# Patient Record
Sex: Male | Born: 1980 | Hispanic: No | Marital: Married | Smoking: Never smoker
Health system: Southern US, Community
[De-identification: ages and names within clinical notes are randomized; demographics above are authoritative.]

---

## 2006-07-11 ENCOUNTER — Emergency Department (HOSPITAL_COMMUNITY): Admission: AC | Admit: 2006-07-11 | Discharge: 2006-07-11 | Payer: Self-pay

## 2015-10-08 ENCOUNTER — Encounter (HOSPITAL_COMMUNITY): Payer: Self-pay | Admitting: Emergency Medicine

## 2015-10-08 ENCOUNTER — Emergency Department (HOSPITAL_COMMUNITY)
Admission: EM | Admit: 2015-10-08 | Discharge: 2015-10-08 | Disposition: A | Discharge status: Home / Self Care | Payer: Self-pay | Attending: Emergency Medicine | Admitting: Emergency Medicine

## 2015-10-08 ENCOUNTER — Emergency Department (HOSPITAL_COMMUNITY): Payer: Self-pay

## 2015-10-08 DIAGNOSIS — N200 Calculus of kidney: Secondary | ICD-10-CM | POA: Insufficient documentation

## 2015-10-08 LAB — URINALYSIS, ROUTINE W REFLEX MICROSCOPIC
Glucose, UA: NEGATIVE mg/dL
Ketones, ur: 15 mg/dL — AB
Nitrite: NEGATIVE
PROTEIN: 30 mg/dL — AB
Specific Gravity, Urine: 1.027 (ref 1.005–1.030)
pH: 5.5 (ref 5.0–8.0)

## 2015-10-08 LAB — COMPREHENSIVE METABOLIC PANEL
ALBUMIN: 4.6 g/dL (ref 3.5–5.0)
ALK PHOS: 70 U/L (ref 38–126)
ALT: 70 U/L — ABNORMAL HIGH (ref 17–63)
ANION GAP: 9 (ref 5–15)
AST: 30 U/L (ref 15–41)
BILIRUBIN TOTAL: 0.4 mg/dL (ref 0.3–1.2)
BUN: 10 mg/dL (ref 6–20)
CALCIUM: 9.2 mg/dL (ref 8.9–10.3)
CO2: 24 mmol/L (ref 22–32)
Chloride: 106 mmol/L (ref 101–111)
Creatinine, Ser: 0.97 mg/dL (ref 0.61–1.24)
GFR calc non Af Amer: >60 mL/min (ref >60)
Glucose, Bld: 111 mg/dL — ABNORMAL HIGH (ref 65–99)
POTASSIUM: 3.6 mmol/L (ref 3.5–5.1)
SODIUM: 139 mmol/L (ref 135–145)
TOTAL PROTEIN: 7.7 g/dL (ref 6.5–8.1)

## 2015-10-08 LAB — URINE MICROSCOPIC-ADD ON

## 2015-10-08 LAB — CBC
HEMATOCRIT: 44 % (ref 39.0–52.0)
HEMOGLOBIN: 15.1 g/dL (ref 13.0–17.0)
MCH: 29.2 pg (ref 26.0–34.0)
MCHC: 34.3 g/dL (ref 30.0–36.0)
MCV: 85.1 fL (ref 78.0–100.0)
Platelets: 272 10*3/uL (ref 150–400)
RBC: 5.17 MIL/uL (ref 4.22–5.81)
RDW: 12.9 % (ref 11.5–15.5)
WBC: 11.3 10*3/uL — ABNORMAL HIGH (ref 4.0–10.5)

## 2015-10-08 LAB — LIPASE, BLOOD: Lipase: 17 U/L (ref 11–51)

## 2015-10-08 MED ORDER — OXYCODONE-ACETAMINOPHEN 5-325 MG PO TABS: 1.0000 | ORAL_TABLET | Freq: Four times a day (QID) | ORAL | Status: DC | PRN

## 2015-10-08 MED ORDER — KETOROLAC TROMETHAMINE 30 MG/ML IJ SOLN
30.0000 mg | Freq: Once | INTRAMUSCULAR | Status: AC
  Administered 2015-10-08: 30 mg via INTRAVENOUS
  Filled 2015-10-08: qty 1

## 2015-10-08 MED ORDER — OXYCODONE-ACETAMINOPHEN 5-325 MG PO TABS
2.0000 | ORAL_TABLET | Freq: Once | ORAL | Status: AC
  Administered 2015-10-08: 2 via ORAL
  Filled 2015-10-08: qty 2

## 2015-10-08 MED ORDER — TAMSULOSIN HCL 0.4 MG PO CAPS: 0.4000 mg | ORAL_CAPSULE | Freq: Every day | ORAL | Status: DC

## 2015-10-08 MED ORDER — SODIUM CHLORIDE 0.9 % IV BOLUS (SEPSIS)
1000.0000 mL | Freq: Once | INTRAVENOUS | Status: AC
  Administered 2015-10-08: 1000 mL via INTRAVENOUS

## 2015-10-08 MED ORDER — HYDROMORPHONE HCL 1 MG/ML IJ SOLN
1.0000 mg | Freq: Once | INTRAMUSCULAR | Status: AC
  Administered 2015-10-08: 1 mg via INTRAVENOUS
  Filled 2015-10-08: qty 1

## 2015-10-08 MED ORDER — PROMETHAZINE HCL 25 MG PO TABS: 25.0000 mg | ORAL_TABLET | Freq: Four times a day (QID) | ORAL | Status: DC | PRN

## 2015-10-08 NOTE — ED Notes (Signed)
MD at bedside. 

## 2015-10-08 NOTE — ED Notes (Signed)
Pt asking for more pain medication. Explained pt has already had over the maximum amount of pain medication we can give that was given by EMS ( fentanyl). Explained we are waiting on a room to become available for a provider to see the pt. Pt walked into hall and would not go back into room. Charge RN spoke with pt and going to speak with a physician regarding pain medication. Family started filming in hallway. Explained photography is not allowed due to privacy concerns with other patients. Family put away phone, explained if filming started again security would be called.

## 2015-10-08 NOTE — ED Provider Notes (Signed)
CSN: [id number]     Arrival date & time 10/08/15  1556 History   First MD Initiated Contact with Patient 10/08/15 1652     Chief Complaint  Patient presents with  . Flank Pain     (Consider location/radiation/quality/duration/timing/severity/associated sxs/prior Treatment) HPI Comments: 35 year old male who presents with right lower quadrant and right flank pain. The patient states that yesterday he began having right lower quadrant pain. The pain was mild and eventually got better without intervention. Today while at work, his pain returned and he also began having pain in his right flank. The pain became severe at work and he called EMS. He received 250 g of fentanyl in route but states that his pain is still severe. The pain radiates into his groin but he denies any specific testicular pain. No problems with urination or hematuria that he is noticed. No diarrhea or vomiting but he does endorse nausea. The pain feels better when he is moving around.  Patient is a 35 y.o. male presenting with flank pain. The history is provided by the patient.  Flank Pain    History reviewed. No pertinent past medical history. History reviewed. No pertinent past surgical history. History reviewed. No pertinent family history. Social History  Substance Use Topics  . Smoking status: Never Smoker   . Smokeless tobacco: None  . Alcohol Use: No    Review of Systems  Genitourinary: Positive for flank pain.   10 Systems reviewed and are negative for acute change except as noted in the HPI.    Allergies  Review of patient's allergies indicates no known allergies.  Home Medications   Prior to Admission medications   Medication Sig Start Date End Date Taking? Authorizing Provider  oxyCODONE-acetaminophen (PERCOCET) 5-325 MG tablet Take 1-2 tablets by mouth every 6 (six) hours as needed for severe pain. 10/08/15   Laurence Spates, MD  promethazine (PHENERGAN) 25 MG tablet Take 1 tablet (25 mg  total) by mouth every 6 (six) hours as needed for nausea or vomiting. 10/08/15   Laurence Spates, MD  tamsulosin (FLOMAX) 0.4 MG CAPS capsule Take 1 capsule (0.4 mg total) by mouth daily. 10/08/15   Ambrose Finland Mayra Brahm, MD   BP 120/71 mmHg  Pulse 71  Temp(Src) 97.8 F (36.6 C) (Oral)  Resp 14  Ht  (1.727 m)  Wt 235 lb (106.595 kg)  BMI 35.74 kg/m2  SpO2 94% Physical Exam  Constitutional: He is oriented to person, place, and time. He appears well-developed and well-nourished.  Uncomfortable, pacing room  HENT:  Head: Normocephalic and atraumatic.  Mouth/Throat: Oropharynx is clear and moist.  Moist mucous membranes  Eyes: Conjunctivae are normal. Pupils are equal, round, and reactive to light.  Neck: Neck supple.  Cardiovascular: Normal rate, regular rhythm and normal heart sounds.   No murmur heard. Pulmonary/Chest: Effort normal and breath sounds normal.  Abdominal: Soft. Bowel sounds are normal. He exhibits no distension. There is tenderness (RLQ and R of umbilicus tenderness).  Genitourinary:  R CVA tenderness  Musculoskeletal: He exhibits no edema.  Neurological: He is alert and oriented to person, place, and time.  Fluent speech, normal gait  Skin: Skin is warm and dry.  Psychiatric: Judgment normal.  Distressed  Nursing note and vitals reviewed.   ED Course  Procedures (including critical care time) Labs Review Labs Reviewed  COMPREHENSIVE METABOLIC PANEL - Abnormal; Notable for the following:    Glucose, Bld 111 (*)    ALT 70 (*)  All other components within normal limits  CBC - Abnormal; Notable for the following:    WBC 11.3 (*)    All other components within normal limits  URINALYSIS, ROUTINE W REFLEX MICROSCOPIC (NOT AT [facility]) - Abnormal; Notable for the following:    Color, Urine AMBER (*)    APPearance TURBID (*)    Hgb urine dipstick LARGE (*)    Bilirubin Urine SMALL (*)    Ketones, ur 15 (*)    Protein, ur 30 (*)    Leukocytes, UA TRACE  (*)    All other components within normal limits  URINE MICROSCOPIC-ADD ON - Abnormal; Notable for the following:    Squamous Epithelial / LPF 0-5 (*)    Bacteria, UA FEW (*)    Casts HYALINE CASTS (*)    Crystals CA OXALATE CRYSTALS (*)    All other components within normal limits  LIPASE, BLOOD    Imaging Review Ct Renal Stone Study  10/08/2015  CLINICAL DATA:  Right flank pain, right lower quadrant pain. EXAM: CT ABDOMEN AND PELVIS WITHOUT CONTRAST TECHNIQUE: Multidetector CT imaging of the abdomen and pelvis was performed following the standard protocol without IV contrast. COMPARISON:  None. FINDINGS: Lower chest:  No acute findings. Hepatobiliary: No mass visualized on this un-enhanced exam. Pancreas: No mass or inflammatory process identified on this un-enhanced exam. Spleen: Within normal limits in size. Adrenals/Urinary Tract: Normal adrenal glands. 4 mm proximal right ureteral calculus resulting in mild hydronephrosis. Normal left kidney. Normal bladder. Stomach/Bowel: No evidence of obstruction, inflammatory process, or abnormal fluid collections. Vascular/Lymphatic: No pathologically enlarged lymph nodes. No evidence of abdominal aortic aneurysm. Reproductive: No mass or other significant abnormality. Other: None. Musculoskeletal:  No suspicious bone lesions identified. IMPRESSION: 1. 4 mm proximal right ureteral calculus resulting in mild hydronephrosis. Electronically Signed   By: Elige Ko   On: 10/08/2015 18:06   I have personally reviewed and evaluated these lab results as part of my medical decision-making.   EKG Interpretation None     Medications  HYDROmorphone (DILAUDID) injection 1 mg (1 mg Intravenous Given 10/08/15 1706)  sodium chloride 0.9 % bolus 1,000 mL (0 mLs Intravenous Stopped 10/08/15 1819)  oxyCODONE-acetaminophen (PERCOCET/ROXICET) 5-325 MG per tablet 2 tablet (2 tablets Oral Given 10/08/15 1852)  ketorolac (TORADOL) 30 MG/ML injection 30 mg (30 mg  Intravenous Given 10/08/15 1852)    MDM   Final diagnoses:  Kidney stone    Patient presents with right lower quadrant pain that began yesterday and returns today as well as right flank pain and nausea. He was uncomfortable and pacing the room at presentation. Vital signs stable. Right lower quadrant, right mid abdominal, and right flank tenderness noted. He states his pain is better when moving around, therefore I suspect kidney stone more likely than appendicitis given his presentation. Gave the patient Dilaudid and obtained above lab work.UA with large amount of blood, WBC 11.3. Obtained CT renal study to evaluate for kidney stone. CT showed a 4 mm proximal right ureteral stone with mild hydronephrosis.   On reexamination after receiving Percocet and Toradol, the patient was resting comfortably. I discussed CT findings and discussed treatment of kidney stone including pain and nausea control as well as urology follow-up. Patient without vomiting and pain well controlled at time of discharge. Provided with pain medications and Phenergan to use as needed at home as well as urology clinic information. Extensively reviewed return precautions including fever, intractable pain, or intractable vomiting. Patient voiced understanding and was  discharged in satisfactory condition.   Laurence Spatesachel Morgan Debralee Braaksma, MD 10/09/15 [phone]

## 2015-10-08 NOTE — ED Notes (Signed)
Pt reports of RLQ abd pain radiating down to groin that started last night. Went to work this am [employer] and symptoms became worse. No in L flank. EMS gave 4mg  zofran and 250mcg fentanyl prior to arrival.

## 2017-05-30 ENCOUNTER — Ambulatory Visit (HOSPITAL_COMMUNITY)
Admission: EM | Admit: 2017-05-30 | Discharge: 2017-05-30 | Disposition: A | Discharge status: Home / Self Care | Payer: Self-pay | Attending: Family Medicine | Admitting: Family Medicine

## 2017-05-30 ENCOUNTER — Ambulatory Visit (INDEPENDENT_AMBULATORY_CARE_PROVIDER_SITE_OTHER): Payer: Self-pay

## 2017-05-30 ENCOUNTER — Other Ambulatory Visit: Payer: Self-pay

## 2017-05-30 ENCOUNTER — Encounter (HOSPITAL_COMMUNITY): Payer: Self-pay

## 2017-05-30 DIAGNOSIS — M79672 Pain in left foot: Secondary | ICD-10-CM

## 2017-05-30 DIAGNOSIS — M25512 Pain in left shoulder: Secondary | ICD-10-CM

## 2017-05-30 DIAGNOSIS — M5489 Other dorsalgia: Secondary | ICD-10-CM

## 2017-05-30 DIAGNOSIS — M542 Cervicalgia: Secondary | ICD-10-CM

## 2017-05-30 MED ORDER — MELOXICAM 7.5 MG PO TABS: 7.5000 mg | ORAL_TABLET | Freq: Every day | ORAL | 0 refills | Status: DC

## 2017-05-30 MED ORDER — CYCLOBENZAPRINE HCL 10 MG PO TABS: 5.0000 mg | ORAL_TABLET | Freq: Every evening | ORAL | 0 refills | Status: DC | PRN
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
## 2017-09-06 ENCOUNTER — Encounter (HOSPITAL_COMMUNITY): Payer: Self-pay | Admitting: Emergency Medicine

## 2017-09-06 ENCOUNTER — Emergency Department (HOSPITAL_COMMUNITY)
Admission: EM | Admit: 2017-09-06 | Discharge: 2017-09-06 | Disposition: A | Discharge status: Home / Self Care | Payer: Self-pay | Attending: Emergency Medicine | Admitting: Emergency Medicine

## 2017-09-06 DIAGNOSIS — Z23 Encounter for immunization: Secondary | ICD-10-CM | POA: Insufficient documentation

## 2017-09-06 DIAGNOSIS — Y939 Activity, unspecified: Secondary | ICD-10-CM | POA: Insufficient documentation

## 2017-09-06 DIAGNOSIS — W501XXA Accidental kick by another person, initial encounter: Secondary | ICD-10-CM | POA: Insufficient documentation

## 2017-09-06 DIAGNOSIS — Y999 Unspecified external cause status: Secondary | ICD-10-CM | POA: Insufficient documentation

## 2017-09-06 DIAGNOSIS — Y929 Unspecified place or not applicable: Secondary | ICD-10-CM | POA: Insufficient documentation

## 2017-09-06 DIAGNOSIS — S0181XA Laceration without foreign body of other part of head, initial encounter: Secondary | ICD-10-CM

## 2017-09-06 MED ORDER — TETANUS-DIPHTH-ACELL PERTUSSIS 5-2.5-18.5 LF-MCG/0.5 IM SUSP
0.5000 mL | Freq: Once | INTRAMUSCULAR | Status: AC
  Administered 2017-09-06: 0.5 mL via INTRAMUSCULAR
  Filled 2017-09-06: qty 0.5
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
## 2017-12-10 IMAGING — CT CT RENAL STONE PROTOCOL
2 of 3 series · 17 of 46 positions shown, 19 images · non-contrast
Comparison: None.

CLINICAL DATA: Right flank pain, right lower quadrant pain.

EXAM:
CT ABDOMEN AND PELVIS WITHOUT CONTRAST
TECHNIQUE: Multidetector CT imaging of the abdomen and pelvis was performed
following the standard protocol without IV contrast.

[Series 4: lung · axial · 0.85mm/px · z∈[-80,+34]mm · 14 of 67 slices shown, 16 images]
[im 5/67  soft-tissue]
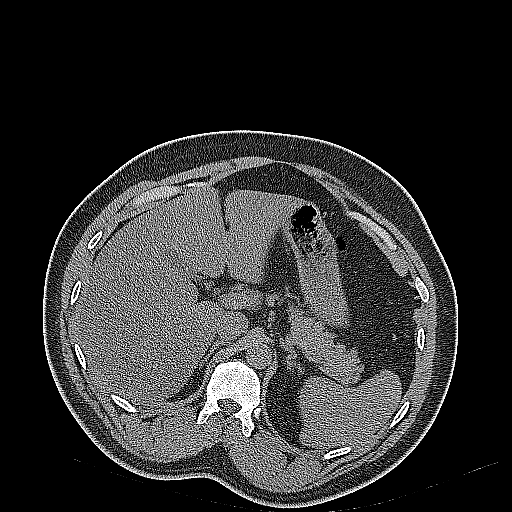
[im 5/67  bone]
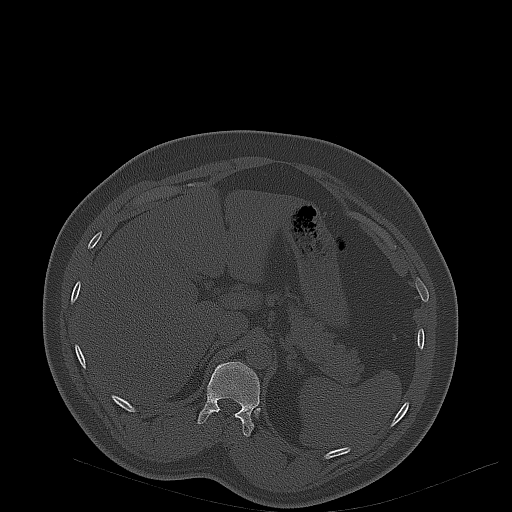
[im 9/67  soft-tissue]
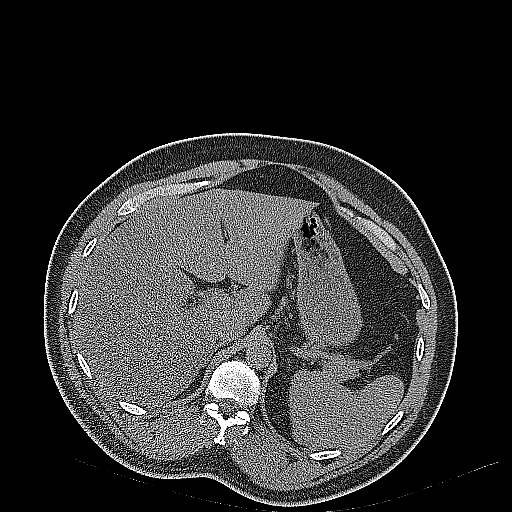
[im 13/67  soft-tissue]
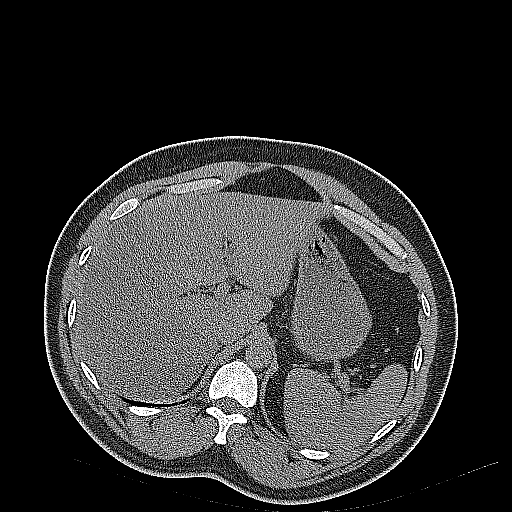
[im 18/67  soft-tissue]
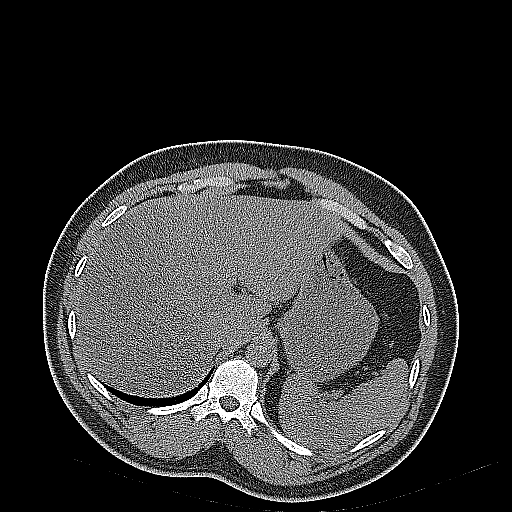
[im 22/67  soft-tissue]
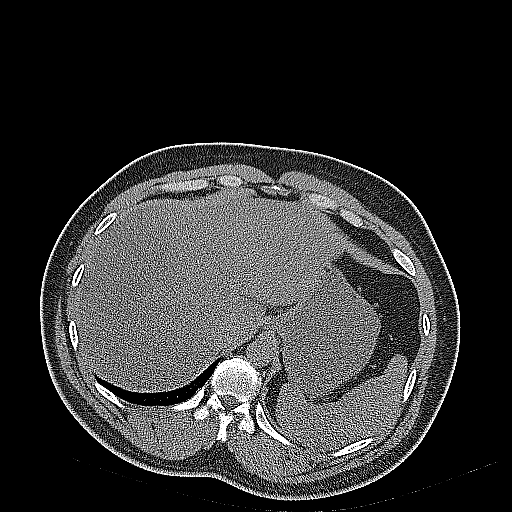
[im 26/67  soft-tissue]
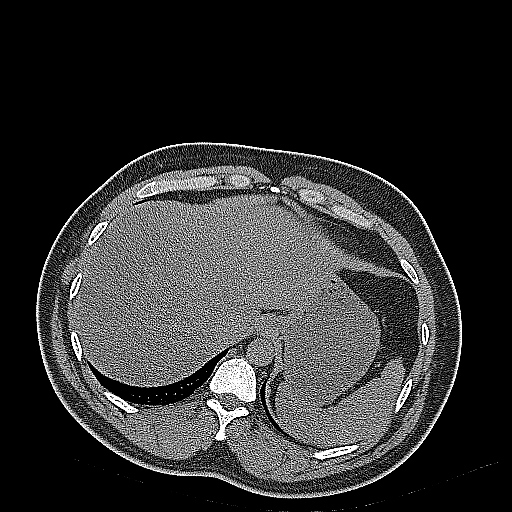
[im 30/67  soft-tissue]
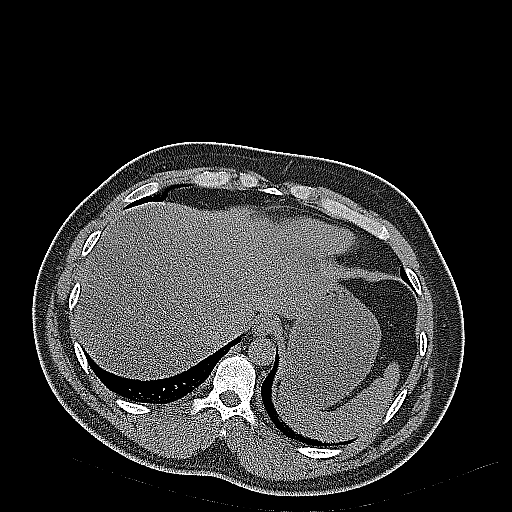
[im 37/67  soft-tissue]
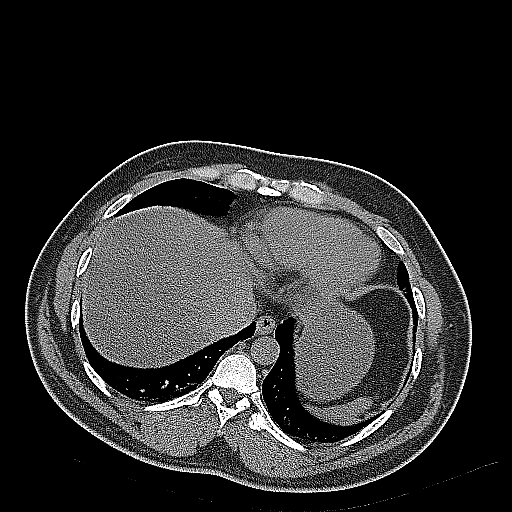
[im 41/67  soft-tissue]
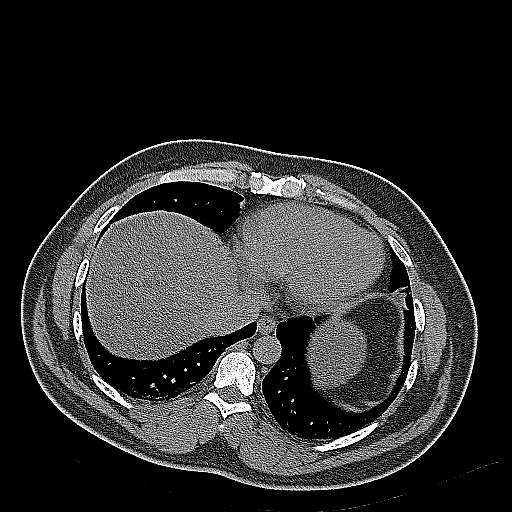
[im 41/67  bone]
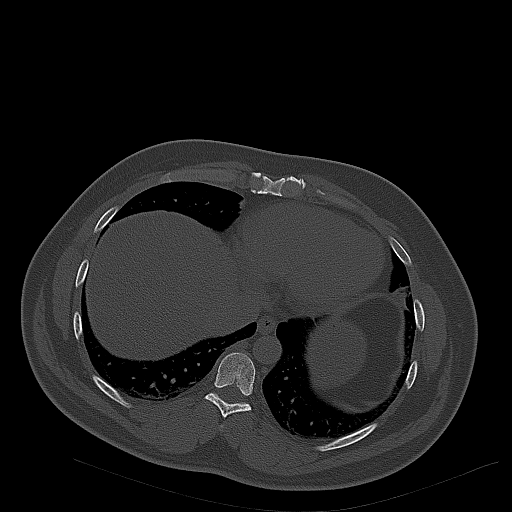
[im 45/67  soft-tissue]
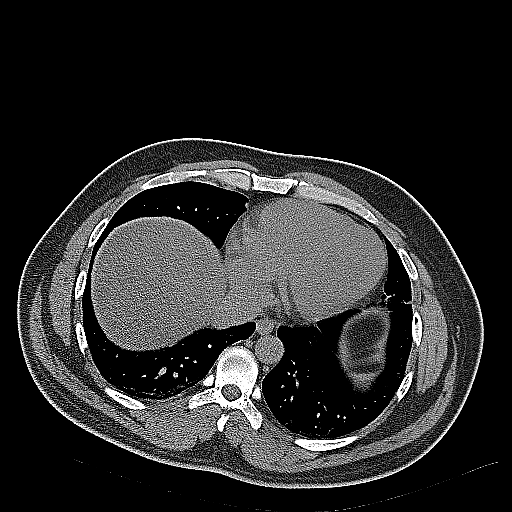
[im 49/67  soft-tissue]
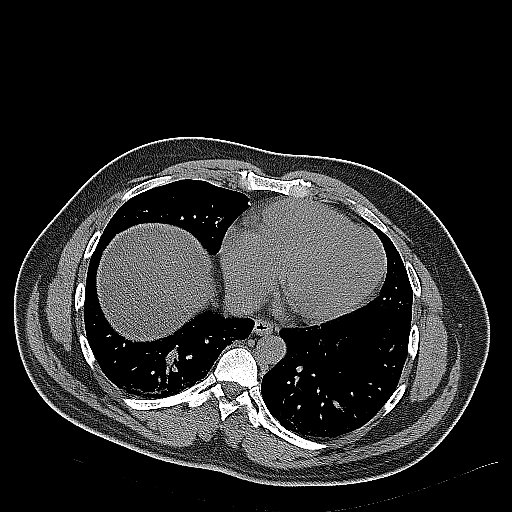
[im 54/67  soft-tissue]
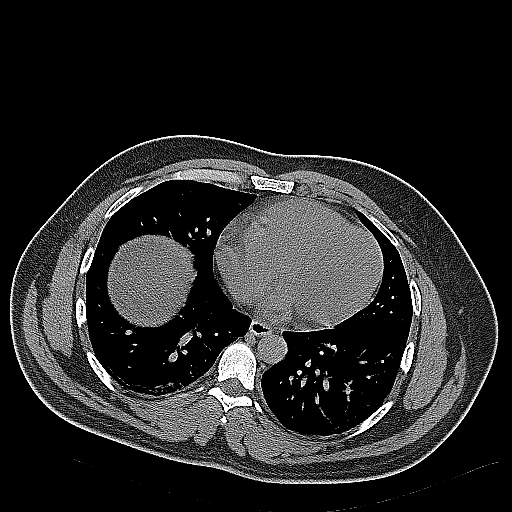
[im 58/67  soft-tissue]
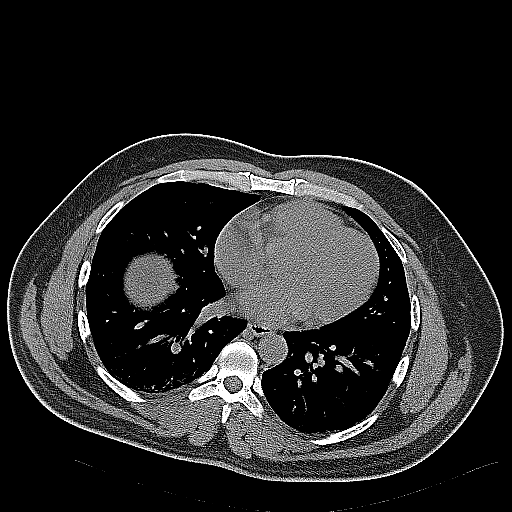
[im 62/67  soft-tissue]
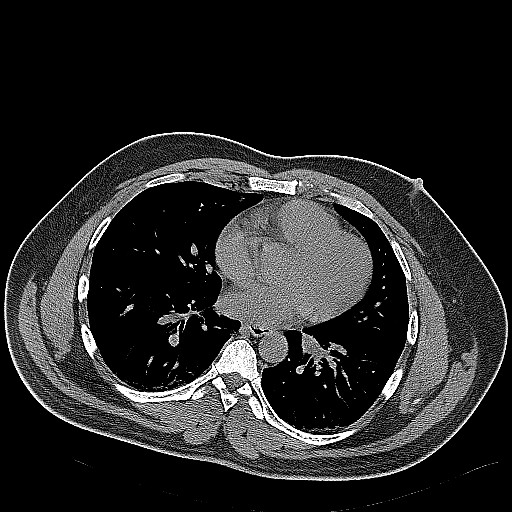

[Series 5: coronal · coronal · 0.84mm/px · 3 of 178 slices shown]
[im 60/178  soft-tissue]
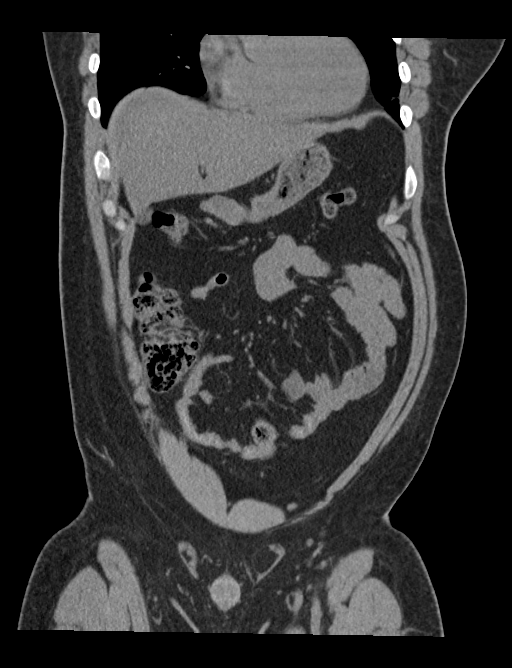
[im 79/178  soft-tissue]
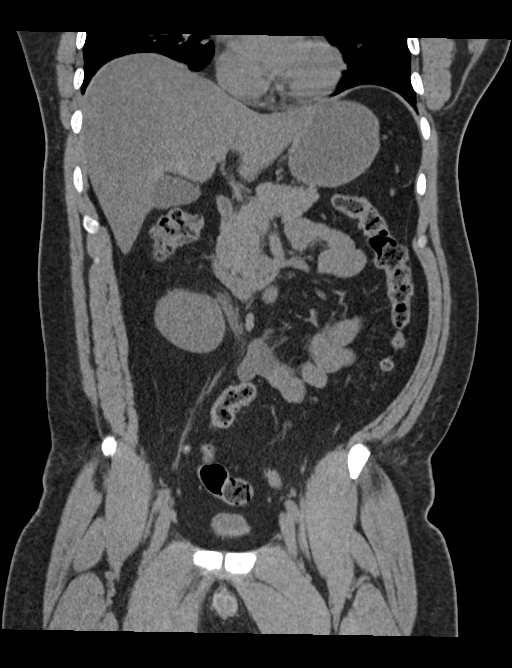
[im 99/178  soft-tissue]
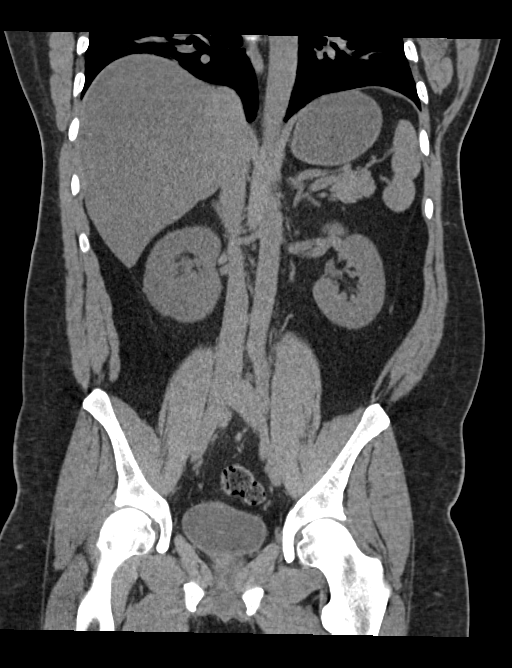

[17 of 46 positions shown; findings below may reference images not displayed]

FINDINGS: Lower chest:  No acute findings.

Hepatobiliary: No mass visualized on this un-enhanced exam.

Pancreas: No mass or inflammatory process identified on this
un-enhanced exam.

Spleen: Within normal limits in size.

Adrenals/Urinary Tract: Normal adrenal glands. 4 mm proximal right
ureteral calculus resulting in mild hydronephrosis. Normal left
kidney. Normal bladder.

Stomach/Bowel: No evidence of obstruction, inflammatory process, or
abnormal fluid collections.

Vascular/Lymphatic: No pathologically enlarged lymph nodes. No
evidence of abdominal aortic aneurysm.

Reproductive: No mass or other significant abnormality.

Other: None.

Musculoskeletal:  No suspicious bone lesions identified.
IMPRESSION: 1. 4 mm proximal right ureteral calculus resulting in mild
hydronephrosis.

## 2018-11-14 ENCOUNTER — Other Ambulatory Visit: Payer: Self-pay

## 2018-11-14 ENCOUNTER — Emergency Department (HOSPITAL_COMMUNITY)
Admission: EM | Admit: 2018-11-14 | Discharge: 2018-11-14 | Disposition: A | Discharge status: Home / Self Care | Payer: Self-pay | Attending: Emergency Medicine | Admitting: Emergency Medicine

## 2018-11-14 ENCOUNTER — Encounter (HOSPITAL_COMMUNITY): Payer: Self-pay | Admitting: *Deleted

## 2018-11-14 DIAGNOSIS — R21 Rash and other nonspecific skin eruption: Secondary | ICD-10-CM | POA: Insufficient documentation

## 2018-11-14 DIAGNOSIS — Z79899 Other long term (current) drug therapy: Secondary | ICD-10-CM | POA: Insufficient documentation

## 2018-11-14 MED ORDER — PREDNISONE 10 MG (21) PO TBPK: ORAL_TABLET | Freq: Every day | ORAL | 0 refills | Status: DC

## 2018-11-14 MED ORDER — DIPHENHYDRAMINE HCL 25 MG PO TABS: 25.0000 mg | ORAL_TABLET | Freq: Four times a day (QID) | ORAL | 0 refills | Status: DC

## 2018-11-14 MED ORDER — FAMOTIDINE 20 MG PO TABS: 20.0000 mg | ORAL_TABLET | Freq: Two times a day (BID) | ORAL | 0 refills | Status: DC

## 2018-11-14 NOTE — ED Provider Notes (Signed)
[facility] Provider Note   CSN: [id number] Arrival date & time: 11/14/18  1433    History   Chief Complaint Chief Complaint  Patient presents with   Rash    HPI Gregory Mclaughlin is a 38 y.o. male who presents for evaluation of rash noted to bilateral fevers, right upper extremity.  He reports that about 5 days ago, he was working outside and states that he thinks he got into some poison ivy.  He also states he was stung by some yellow jacket.  He states that initially, he had some rash to his arms and hands.  He states he took over-the-counter Benadryl which improved rash on hands.  He states that about 3 days ago, he started noticing some irritation to his bilateral ears.  He states that the area started getting swollen, inflamed.  He states that he is continue taking the Benadryl with no improvement in symptoms.  He has noted some drainage from the ears.  He has not noted any fevers.  He states he has not had any difficulty swallowing, breathing, vomiting, swelling of his tongue or lips.  Patient denies any other rash in any other places.  He states he thinks he may have an allergy to yellow jacket but does not know of any other allergies.     The history is provided by the patient.    History reviewed. No pertinent past medical history.  There are no active problems to display for this patient.   History reviewed. No pertinent surgical history.      Home Medications    Prior to Admission medications   Medication Sig Start Date End Date Taking? Authorizing Provider  cyclobenzaprine (FLEXERIL) 10 MG tablet Take 0.5-1 tablets (5-10 mg total) by mouth at bedtime as needed for muscle spasms. 05/30/17   Cathie HoopsYu, Amy V, PA-C  diphenhydrAMINE (BENADRYL) 25 MG tablet Take 1 tablet (25 mg total) by mouth every 6 (six) hours for 4 days. 11/14/18 11/18/18  Maxwell CaulLayden, Estefana Taylor A, PA-C  famotidine (PEPCID) 20 MG tablet Take 1 tablet (20 mg total) by mouth 2 (two)  times daily for 4 days. 11/14/18 11/18/18  Maxwell CaulLayden, Brentin Shin A, PA-C  meloxicam (MOBIC) 7.5 MG tablet Take 1 tablet (7.5 mg total) by mouth daily. 05/30/17   Cathie HoopsYu, Amy V, PA-C  predniSONE (STERAPRED UNI-PAK 21 TAB) 10 MG (21) TBPK tablet Take by mouth daily. Take 6 tabs by mouth daily  for 2 days, then 5 tabs for 2 days, then 4 tabs for 2 days, then 3 tabs for 2 days, 2 tabs for 2 days, then 1 tab by mouth daily for 2 days 11/14/18   Maxwell CaulLayden, Jeweliana Dudgeon A, PA-C    Family History No family history on file.  Social History Social History   Tobacco Use   Smoking status: Never Smoker   Smokeless tobacco: Never Used  Substance Use Topics   Alcohol use: No   Drug use: No     Allergies   Patient has no known allergies.   Review of Systems Review of Systems  Constitutional: Negative for fever.  HENT: Positive for facial swelling. Negative for trouble swallowing.   Respiratory: Negative for shortness of breath.   Skin: Positive for rash.  All other systems reviewed and are negative.    Physical Exam Updated Vital Signs BP 110/72    Pulse 66    Temp 98.3 F (36.8 C)    Resp 18    Ht 5\' 8"  (1.727  m)    Wt 106.6 kg    SpO2 97%    BMI 35.73 kg/m   Physical Exam Vitals signs and nursing note reviewed.  Constitutional:      Appearance: He is well-developed.  HENT:     Head: Normocephalic and atraumatic.     Comments: Face is symmetric in appearance.  No overlying warmth, erythema.  No intraoral swelling.  No swelling noted to floor of mouth.    Right Ear: Tympanic membrane normal.     Left Ear: Tympanic membrane normal.     Ears:     Comments: Bilateral ears are edematous with some overlying yellow crusting and erythema.  He has mild maculopapular rash noted around the ears that extends to the base of the skull.  Bilateral mastoid processes are without any overlying warmth, erythema.  Bilateral TMs are without erythema, bulging.    Mouth/Throat:     Comments: Airway is patent, phonation  is intact. Uvula is midline.  No trismus. Eyes:     General: No scleral icterus.       Right eye: No discharge.        Left eye: No discharge.     Conjunctiva/sclera: Conjunctivae normal.  Pulmonary:     Effort: Pulmonary effort is normal.     Comments: Lungs clear to auscultation bilaterally.  Symmetric chest rise.  No wheezing, rales, rhonchi. Skin:    General: Skin is warm and dry.     Findings: Rash present.     Comments: Small amount of maculopapular rash with some linear streaking noted to the upper left arm consistent with poison ivy.  No rash noted on palms.  No intraoral lesions.  Neurological:     Mental Status: He is alert.  Psychiatric:        Speech: Speech normal.        Behavior: Behavior normal.              ED Treatments / Results  Labs (all labs ordered are listed, but only abnormal results are displayed) Labs Reviewed - No data to display  EKG None  Radiology No results found.  Procedures Procedures (including critical care time)  Medications Ordered in ED Medications - No data to display   Initial Impression / Assessment and Plan / ED Course  I have reviewed the triage vital signs and the nursing notes.  Pertinent labs & imaging results that were available during my care of the patient were reviewed by me and considered in my medical decision making (see chart for details).        38 year old male who presents for evaluation of swelling, erythema, irritation noted to bilateral ears.  Reports history of bee sting and poison ivy about 5 days ago.  Initially started on his arms but that resolved after Benadryl.  Noticed about 3 days ago, he started having swelling, irritation of his ears.  No fevers, vomiting. Patient is afebrile, non-toxic appearing, sitting comfortably on examination table. Vital signs reviewed and stable.  On examination, both ears appear edematous with some overlying yellow crusting and erythema.  TMs bilaterally appear  normal.  He has some rash noted around the ear that extends over the base of the skull.  The mastoid processes bilaterally they are without any warmth, erythema, edema.  His exam is not concerning for mastoiditis.  Additionally, he has no systemic signs of illness such as fever, vomiting.  I suspect this is most likely allergic reaction/poison ivy from previous encounter.  At this time, do not feel that he needs any imaging.  Discussed patient with Dr. Reather Converse who is agreeable to plan.  Plan to treat with prednisone.  We will also encourage Benadryl, Pepcid.  I discussed with patient that he needs close follow-up in 3 days with either primary care or ENT.  He cannot arrange follow-up and has worsening symptoms, he is return the emergency department. At this time, patient exhibits no emergent life-threatening condition that require further evaluation in ED or admission. Patient had ample opportunity for questions and discussion. All patient's questions were answered with full understanding. Strict return precautions discussed. Patient expresses understanding and agreement to plan.    Portions of this note were generated with [employer]. Dictation errors may occur despite best attempts at proofreading.   Final Clinical Impressions(s) / ED Diagnoses   Final diagnoses:  Rash    ED Discharge Orders         Ordered    predniSONE (STERAPRED UNI-PAK 21 TAB) 10 MG (21) TBPK tablet  Daily     11/14/18 1716    diphenhydrAMINE (BENADRYL) 25 MG tablet  Every 6 hours     11/14/18 1716    famotidine (PEPCID) 20 MG tablet  2 times daily     11/14/18 1716           Desma Mcgregor 11/15/18 0012    Elnora Morrison, MD 11/15/18 [phone]

## 2018-11-14 NOTE — ED Triage Notes (Signed)
The pt has a rash all over his body  He was sting by yellow jackets 3-4 days ago he was itching all over and he thinks that he w was in poison ivy  Both ears re and sxwsolledn  And he has rash covering the exp[osed [place] of his body  No resp difficulty

## 2018-11-14 NOTE — ED Notes (Signed)
Pt did not want to remove pants, pt provided with gown and pt took off shirt.

## 2018-11-14 NOTE — ED Notes (Signed)
Patient Alert and oriented to baseline. Stable and ambulatory to baseline. Patient verbalized understanding of the discharge instructions.  Patient belongings were taken by the patient.   

## 2018-11-14 NOTE — Discharge Instructions (Signed)
Take prednisone as directed.  Take Benadryl and Pepcid as directed.  As we discussed, you will need for close follow-up in 3 days with either ENT or [facility] primary care.  If you cannot arrange follow-up and your symptoms are worse or not better, return emergency department.  Return emergency department sooner if you experience any fever, difficulty swallowing, difficulty breathing, vomiting, swelling of your tongue or lips or any other worsening or concerning symptoms.

## 2019-10-29 ENCOUNTER — Emergency Department (HOSPITAL_COMMUNITY)
Admission: EM | Admit: 2019-10-29 | Discharge: 2019-10-30 | Disposition: A | Discharge status: Home / Self Care | Payer: Self-pay | Attending: Emergency Medicine | Admitting: Emergency Medicine

## 2019-10-29 DIAGNOSIS — T782XXA Anaphylactic shock, unspecified, initial encounter: Secondary | ICD-10-CM | POA: Insufficient documentation

## 2019-10-29 MED ORDER — SODIUM CHLORIDE 0.9 % IV BOLUS
1000.0000 mL | Freq: Once | INTRAVENOUS | Status: AC
  Administered 2019-10-29: 1000 mL via INTRAVENOUS

## 2019-10-29 MED ORDER — EPINEPHRINE 0.3 MG/0.3ML IJ SOAJ: 0.3000 mg | INTRAMUSCULAR | 0 refills | Status: AC | PRN

## 2019-10-29 MED ORDER — METHYLPREDNISOLONE SODIUM SUCC 125 MG IJ SOLR
125.0000 mg | Freq: Once | INTRAMUSCULAR | Status: AC
  Administered 2019-10-29: 125 mg via INTRAVENOUS
  Filled 2019-10-29: qty 2

## 2019-10-29 MED ORDER — PREDNISONE 20 MG PO TABS: 40.0000 mg | ORAL_TABLET | Freq: Every day | ORAL | 0 refills | Status: AC

## 2019-10-29 MED ORDER — FAMOTIDINE IN NACL 20-0.9 MG/50ML-% IV SOLN
20.0000 mg | Freq: Once | INTRAVENOUS | Status: AC
  Administered 2019-10-29: 20 mg via INTRAVENOUS
  Filled 2019-10-29: qty 50

## 2019-10-29 NOTE — Discharge Instructions (Signed)
Continue 25 to 50 mg of Benadryl every 8 hours as needed as well as taken prednisone as prescribed.

## 2019-10-29 NOTE — ED Provider Notes (Signed)
[facility] EMERGENCY DEPARTMENT Provider Note   CSN: [id number] Arrival date & time: 10/29/19  2055     History Chief Complaint  Patient presents with  . Insect Bite    Gregory Mclaughlin is a 39 y.o. male.  The history is provided by the patient.  Rash Location:  Full body Quality: itchiness and redness   Severity:  Severe Onset quality:  Sudden Progression:  Improving Chronicity:  New Context: insect bite/sting   Relieved by: epi and benadryl with EMS. Associated symptoms: periorbital edema   Associated symptoms: no abdominal pain, no fever, no joint pain, no shortness of breath, no sore throat, no throat swelling, no tongue swelling and not vomiting        No past medical history on file.  There are no problems to display for this patient.   No past surgical history on file.     No family history on file.  Social History   Tobacco Use  . Smoking status: Never Smoker  . Smokeless tobacco: Never Used  Substance Use Topics  . Alcohol use: No  . Drug use: No    Home Medications Prior to Admission medications   Medication Sig Start Date End Date Taking? Authorizing Provider  acetaminophen (TYLENOL) 500 MG tablet Take 500 mg by mouth every 6 (six) hours as needed for headache (pain).   Yes [provider]    Allergies    Bee venom  Review of Systems   Review of Systems  Constitutional: Negative for chills and fever.  HENT: Negative for ear pain and sore throat.   Eyes: Negative for pain and visual disturbance.  Respiratory: Negative for cough and shortness of breath.   Cardiovascular: Negative for chest pain and palpitations.  Gastrointestinal: Negative for abdominal pain and vomiting.  Genitourinary: Negative for dysuria and hematuria.  Musculoskeletal: Negative for arthralgias and back pain.  Skin: Positive for rash. Negative for color change.  Neurological: Negative for seizures and syncope.  All other systems reviewed and  are negative.   Physical Exam Updated Vital Signs  ED Triage Vitals [10/29/19 2100]  Enc Vitals Group     BP 114/81     Pulse Rate 69     Resp 17     Temp 98.2 F (36.8 C)     Temp Source Oral     SpO2 96 %     Weight      Height      Head Circumference      Peak Flow      Pain Score      Pain Loc      Pain Edu?      Excl. in GC?     Physical Exam Vitals and nursing note reviewed.  Constitutional:      General: He is not in acute distress.    Appearance: He is well-developed. He is not ill-appearing.  HENT:     Head: Normocephalic and atraumatic.     Right Ear: Tympanic membrane normal.     Left Ear: Tympanic membrane normal.     Nose: Nose normal.     Mouth/Throat:     Mouth: Mucous membranes are moist.  Eyes:     Extraocular Movements: Extraocular movements intact.     Conjunctiva/sclera: Conjunctivae normal.     Pupils: Pupils are equal, round, and reactive to light.  Cardiovascular:     Rate and Rhythm: Normal rate and regular rhythm.     Pulses: Normal pulses.  Heart sounds: Normal heart sounds. No murmur heard.   Pulmonary:     Effort: Pulmonary effort is normal. No respiratory distress.     Breath sounds: Normal breath sounds.  Abdominal:     Palpations: Abdomen is soft.     Tenderness: There is no abdominal tenderness.  Musculoskeletal:     Cervical back: Normal range of motion and neck supple.  Skin:    General: Skin is warm and dry.     Capillary Refill: Capillary refill takes less than 2 seconds.     Findings: Rash present.     Comments: Diffuse hives with swelling around the eyes  Neurological:     General: No focal deficit present.     Mental Status: He is alert.     ED Results / Procedures / Treatments   Labs (all labs ordered are listed, but only abnormal results are displayed) Labs Reviewed - No data to display  EKG None  Radiology No results found.  Procedures Procedures (including critical care time)  Medications  Ordered in ED Medications  methylPREDNISolone sodium succinate (SOLU-MEDROL) 125 mg/2 mL injection 125 mg (125 mg Intravenous Given 10/29/19 2114)  sodium chloride 0.9 % bolus 1,000 mL (1,000 mLs Intravenous New Bag/Given 10/29/19 2114)  famotidine (PEPCID) IVPB 20 mg premix (0 mg Intravenous Stopped 10/29/19 2148)    ED Course  I have reviewed the triage vital signs and the nursing notes.  Pertinent labs & imaging results that were available during my care of the patient were reviewed by me and considered in my medical decision making (see chart for details).    MDM Rules/Calculators/A&P                          Gregory Mclaughlin is a 39 year old male who presents to the ED after anaphylactic reaction to bee sting.  Patient already given epinephrine and Benadryl by EMS.  Patient was found on the ground outside by family.  He was mowing the lawn.  It appeared that he ran over a yellowjacket nest.  Patient with hives throughout.  Symptoms have improved following epinephrine and Benadryl.  Suspect anaphylaxis from bee sting.  He appears to be stable.  Patient given IV steroids, IV Pepcid, IV fluid bolus.  Will observe for several hours to see if he has any worsening reaction.  Clear breath sounds.  No signs of respiratory distress.  Patient improving.  Will handoff patient with patient pending reevaluation after his period of observation.  Will need epinephrine pen reordered.  This chart was dictated using voice recognition software.  Despite best efforts to proofread,  errors can occur which can change the documentation meaning.    Final Clinical Impression(s) / ED Diagnoses Final diagnoses:  Anaphylaxis, initial encounter    Rx / DC Orders ED Discharge Orders    None       Virgina Norfolk, DO 10/29/19 2237

## 2019-10-29 NOTE — ED Triage Notes (Signed)
Pt arrived [facility] after pt was found unconscious in yard after pt ran over yellow jacket nest mowing. Pt was given 0.3 of epi and 50 of bendryl. Pt has generalized redness to abd feet and face. Pt is drowsy but oriented.

## 2019-10-30 NOTE — ED Provider Notes (Signed)
Patient signed out to me by Dr. Lockie Mola to monitor.  Patient was treated for acute allergic reaction to hymenoptera envenomation.  Recheck at 12:45 AM reveals that he is symptom-free.  He will therefore be discharged, continue Benadryl and given prescription for EpiPen.   Gilda Crease, MD 10/30/19 [phone]
